# Patient Record
Sex: Female | Born: 1965 | Hispanic: No | Marital: Single
Health system: Southern US, Community
[De-identification: ages and names within clinical notes are randomized; demographics above are authoritative.]

---

## 2001-04-04 ENCOUNTER — Other Ambulatory Visit: Admission: RE | Admit: 2001-04-04 | Discharge: 2001-04-04 | Payer: Self-pay | Admitting: Internal Medicine

## 2003-05-23 ENCOUNTER — Other Ambulatory Visit: Admission: RE | Admit: 2003-05-23 | Discharge: 2003-05-23 | Payer: Self-pay | Admitting: Family Medicine

## 2003-05-24 ENCOUNTER — Other Ambulatory Visit: Admission: RE | Admit: 2003-05-24 | Discharge: 2003-05-24 | Payer: Self-pay | Admitting: Family Medicine

## 2004-05-26 ENCOUNTER — Ambulatory Visit: Payer: Self-pay | Admitting: Family Medicine

## 2004-05-26 ENCOUNTER — Other Ambulatory Visit: Admission: RE | Admit: 2004-05-26 | Discharge: 2004-05-26 | Payer: Self-pay | Admitting: Family Medicine

## 2004-05-27 ENCOUNTER — Encounter: Admission: RE | Admit: 2004-05-27 | Discharge: 2004-05-27 | Payer: Self-pay | Admitting: Family Medicine

## 2004-06-13 ENCOUNTER — Encounter: Admission: RE | Admit: 2004-06-13 | Discharge: 2004-06-13 | Payer: Self-pay | Admitting: Family Medicine

## 2005-01-14 ENCOUNTER — Encounter: Admission: RE | Admit: 2005-01-14 | Discharge: 2005-01-14 | Payer: Self-pay | Admitting: Family Medicine

## 2005-09-08 ENCOUNTER — Other Ambulatory Visit: Admission: RE | Admit: 2005-09-08 | Discharge: 2005-09-08 | Payer: Self-pay | Admitting: Family Medicine

## 2008-03-22 ENCOUNTER — Other Ambulatory Visit: Admission: RE | Admit: 2008-03-22 | Discharge: 2008-03-22 | Payer: Self-pay | Admitting: Family Medicine

## 2008-07-23 ENCOUNTER — Encounter: Admission: RE | Admit: 2008-07-23 | Discharge: 2008-07-23 | Payer: Self-pay | Admitting: Family Medicine

## 2010-02-23 ENCOUNTER — Encounter: Payer: Self-pay | Admitting: Family Medicine

## 2012-02-11 ENCOUNTER — Other Ambulatory Visit: Payer: Self-pay | Admitting: Family Medicine

## 2012-02-11 DIAGNOSIS — N63 Unspecified lump in unspecified breast: Secondary | ICD-10-CM

## 2012-02-19 ENCOUNTER — Ambulatory Visit
Admission: RE | Admit: 2012-02-19 | Discharge: 2012-02-19 | Disposition: A | Discharge status: Home / Self Care | Payer: BC Managed Care – PPO | Source: Ambulatory Visit | Attending: Family Medicine | Admitting: Family Medicine

## 2012-02-19 DIAGNOSIS — N63 Unspecified lump in unspecified breast: Secondary | ICD-10-CM

## 2013-10-05 ENCOUNTER — Other Ambulatory Visit: Payer: Self-pay | Admitting: Family Medicine

## 2013-10-05 DIAGNOSIS — N6002 Solitary cyst of left breast: Secondary | ICD-10-CM

## 2013-10-13 ENCOUNTER — Ambulatory Visit
Admission: RE | Admit: 2013-10-13 | Discharge: 2013-10-13 | Disposition: A | Discharge status: Home / Self Care | Payer: BC Managed Care – PPO | Source: Ambulatory Visit | Attending: Family Medicine | Admitting: Family Medicine

## 2013-10-13 ENCOUNTER — Encounter (INDEPENDENT_AMBULATORY_CARE_PROVIDER_SITE_OTHER): Payer: Self-pay

## 2013-10-13 DIAGNOSIS — N6002 Solitary cyst of left breast: Secondary | ICD-10-CM

## 2014-11-16 ENCOUNTER — Other Ambulatory Visit: Payer: Self-pay

## 2014-11-16 DIAGNOSIS — Z1231 Encounter for screening mammogram for malignant neoplasm of breast: Secondary | ICD-10-CM

## 2014-12-14 ENCOUNTER — Ambulatory Visit
Admission: RE | Admit: 2014-12-14 | Discharge: 2014-12-14 | Disposition: A | Discharge status: Home / Self Care | Payer: BC Managed Care – PPO | Source: Ambulatory Visit

## 2014-12-14 DIAGNOSIS — Z1231 Encounter for screening mammogram for malignant neoplasm of breast: Secondary | ICD-10-CM

## 2016-06-01 ENCOUNTER — Other Ambulatory Visit: Payer: Self-pay | Admitting: Family Medicine

## 2016-06-01 DIAGNOSIS — Z1231 Encounter for screening mammogram for malignant neoplasm of breast: Secondary | ICD-10-CM

## 2016-07-23 ENCOUNTER — Ambulatory Visit
Admission: RE | Admit: 2016-07-23 | Discharge: 2016-07-23 | Disposition: A | Discharge status: Home / Self Care | Payer: BC Managed Care – PPO | Source: Ambulatory Visit | Attending: Family Medicine | Admitting: Family Medicine

## 2016-07-23 DIAGNOSIS — Z1231 Encounter for screening mammogram for malignant neoplasm of breast: Secondary | ICD-10-CM

## 2017-03-15 ENCOUNTER — Encounter: Payer: Self-pay | Admitting: Physical Therapy

## 2017-03-15 ENCOUNTER — Other Ambulatory Visit: Payer: Self-pay

## 2017-03-15 ENCOUNTER — Ambulatory Visit: Payer: BC Managed Care – PPO | Attending: Family Medicine | Admitting: Physical Therapy

## 2017-03-15 ENCOUNTER — Ambulatory Visit: Payer: BC Managed Care – PPO | Admitting: Physical Therapy

## 2017-03-15 DIAGNOSIS — R293 Abnormal posture: Secondary | ICD-10-CM | POA: Insufficient documentation

## 2017-03-15 DIAGNOSIS — M25511 Pain in right shoulder: Secondary | ICD-10-CM | POA: Insufficient documentation

## 2017-03-15 DIAGNOSIS — M545 Low back pain, unspecified: Secondary | ICD-10-CM

## 2017-03-15 DIAGNOSIS — M25611 Stiffness of right shoulder, not elsewhere classified: Secondary | ICD-10-CM | POA: Diagnosis present

## 2017-03-15 NOTE — Therapy (Signed)
[facility] [place] [address] [place], [place], [address] Phone: [phone]   Fax:  [phone]  Physical Therapy Evaluation  Patient Details  Name: Summer Owens MRN: [id number] Date of Birth: December 05, 1965 Referring Provider: Silvestre Moment   Encounter Date: 03/15/2017  PT End of Session - 03/15/17 0949    Visit Number  1    Date for PT Re-Evaluation  05/13/17    PT Start Time  0858    PT Stop Time  0958    PT Time Calculation (min)  60 min    Activity Tolerance  Patient tolerated treatment well    Behavior During Therapy  [facility] for tasks assessed/performed       History reviewed. No pertinent past medical history.  History reviewed. No pertinent surgical history.  There were no vitals filed for this visit.   Subjective Assessment - 03/15/17 0901    Subjective  Patient reports that she feels like the injury happened about 9 months ago when she was doing a crunch and had a 10# weight in her hands.   She reports that she felt right rib pain at that time.  She reports that over time the pain is now in the right shoulder, she reports that she feel that the right rib has an "indentation".  She reports that she has some mild scoliosis and has right hip pain.      Patient Stated Goals  have less pain    Currently in Pain?  Yes    Pain Score  1     Pain Location  Shoulder and right hip    Pain Orientation  Right    Pain Descriptors / Indicators  Tightness    Pain Type  Acute pain    Pain Onset  More than a month ago    Pain Frequency  Constant    Aggravating Factors   for the right shoulder reaching out and back up and back will increase the pain to 4/10, the right hip pain is worse with trying to jog pain a 6/10, standing on a hard surface and looking down.  Driving witout using the cruise control will cause the right hip pain    Pain Relieving Factors  childs pose helps the hip, chanign posistions helps some    Effect of  Pain on Daily Activities  just feels tight and has some pain         [facility] PT Assessment - 03/15/17 0001      Assessment   Medical Diagnosis  right shoulder and hip pain    Referring Provider  Silvestre Moment    Onset Date/Surgical Date  02/12/17    Hand Dominance  Right    Prior Therapy  no      Precautions   Precautions  None      Balance Screen   Has the patient fallen in the past 6 months  No    Has the patient had a decrease in activity level because of a fear of falling?   No    Is the patient reluctant to leave their home because of a fear of falling?   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  Leisure  Dancing 1-2x/week, some free weights at home, pushups and sit ups      Posture/Postural Control   Posture Comments  she has pretty good sitting posture, but reports at work she is ofter leaning and looking to one side that may contribute to her pain      ROM / Strength   AROM / PROM / Strength  AROM;Strength      AROM   Overall AROM Comments  Hip ROM was WFL's some tightness and pain with piriformis stretch    AROM Assessment Site  Shoulder;Hip    Right/Left Shoulder  Right    Right Shoulder Flexion  180 Degrees    Right Shoulder ABduction  160 Degrees    Right Shoulder Internal Rotation  40 Degrees c/o tightness and some pain    Right Shoulder External Rotation  90 Degrees    Right/Left Hip  Right      Strength   Overall Strength Comments  right hip 4+/5, right shoulder 4+/5 except for ER 3+/5 with mild pain      Flexibility   Soft Tissue Assessment /Muscle Length  yes    ITB  tight    Piriformis  tight with some of her pain      Palpation   Palpation comment  she has a knot in the right upper trap and the supraspinatus area, she has tightness in the  cervical parapsinals, she has a lot of spasms in the rhomboid area with some scoliosis palpable.  She had decreased segmental mobility of the thoracic spine and the ribs.      Special Tests   Other special tests  negative empty can, + impingement right shoulder             Objective measurements completed on examination: See above findings.      [facility] Adult PT Treatment/Exercise - 03/15/17 0001      Modalities   Modalities  Electrical Stimulation;Moist Heat      Moist Heat Therapy   Number Minutes Moist Heat  15 Minutes    Moist Heat Location  Cervical;Lumbar Spine      Electrical Stimulation   Electrical Stimulation Location  right upper trap, right SI    Electrical Stimulation Action  Premod    Electrical Stimulation Parameters  supine    Electrical Stimulation Goals  Pain             PT Education - 03/15/17 0948    Education provided  Yes    Education Details  thoracic mobility, scapular stability, cervical stretches, piriformis stretches, information about TENS       PT Short Term Goals - 03/15/17 1009      PT SHORT TERM GOAL #1   Title  independent with initial HEP    Time  2    Period  Weeks    Status  New        PT Long Term Goals - 03/15/17 1010      PT LONG TERM GOAL #1   Title  report pain decreased 50%    Time  8    Period  Weeks    Status  New      PT LONG TERM GOAL #2   Title  increase right shoulder IR to 70 degrees    Time  8    Period  Weeks    Status  New             Plan - 03/15/17 1308  Clinical Impression Statement  Patient reports a hx of scoliosis, she has right shoulder and right low back pain she reports that she feels this started about 9 months ago while doing a sit up with a 10# weight in her hands overhead.  She also reports that she feels her ribs on the right side "sink in".  She ahs significant knot in the right upper trap, decreased rib and thoracic mobility.  Tightness in teh right piriformis mms     Clinical Presentation  Stable    Clinical Decision Making  Low    Rehab Potential  Good    PT Frequency  1x / week    PT Duration  2 weeks    PT Treatment/Interventions  ADLs/Self Care Home Management;Cryotherapy;Electrical Stimulation;Moist Heat;Neuromuscular re-education;Therapeutic exercise;Therapeutic activities;Patient/family education;Manual techniques;Dry needling    PT Next Visit Plan  Patient has a hgih deductible, may not return, gave a lot of information and exercises for her to do on her own    Consulted and Agree with Plan of Care  Patient       Patient will benefit from skilled therapeutic intervention in order to improve the following deficits and impairments:  Decreased strength, Postural dysfunction, Improper body mechanics, Impaired flexibility, Pain, Increased muscle spasms, Decreased range of motion  Visit Diagnosis: Acute pain of right shoulder - Plan: PT plan of care cert/re-cert  Stiffness of right shoulder, not elsewhere classified - Plan: PT plan of care cert/re-cert  Acute right-sided low back pain without sciatica - Plan: PT plan of care cert/re-cert  Abnormal posture - Plan: PT plan of care cert/re-cert     Problem List There are no active problems to display for this patient.   Jearld LeschALBRIGHT,Matika Bartell W., PT 03/15/2017, 10:13 AM  [facility] [place] [address] [place], [place], [address] Phone: [phone]   Fax:  [phone]  Name: Summer Owens MRN: [id number] Date of Birth: 07/03/1965

## 2017-06-29 ENCOUNTER — Other Ambulatory Visit: Payer: Self-pay | Admitting: Family Medicine

## 2017-06-29 DIAGNOSIS — Z1231 Encounter for screening mammogram for malignant neoplasm of breast: Secondary | ICD-10-CM

## 2017-08-17 ENCOUNTER — Ambulatory Visit
Admission: RE | Admit: 2017-08-17 | Discharge: 2017-08-17 | Disposition: A | Discharge status: Home / Self Care | Payer: BC Managed Care – PPO | Source: Ambulatory Visit | Attending: Family Medicine | Admitting: Family Medicine

## 2017-08-17 DIAGNOSIS — Z1231 Encounter for screening mammogram for malignant neoplasm of breast: Secondary | ICD-10-CM

## 2019-05-29 ENCOUNTER — Other Ambulatory Visit: Payer: Self-pay | Admitting: Family Medicine

## 2019-05-29 DIAGNOSIS — Z1231 Encounter for screening mammogram for malignant neoplasm of breast: Secondary | ICD-10-CM

## 2019-06-29 ENCOUNTER — Other Ambulatory Visit: Payer: Self-pay

## 2019-06-29 ENCOUNTER — Ambulatory Visit
Admission: RE | Admit: 2019-06-29 | Discharge: 2019-06-29 | Disposition: A | Discharge status: Home / Self Care | Payer: BC Managed Care – PPO | Source: Ambulatory Visit | Attending: Family Medicine | Admitting: Family Medicine

## 2019-06-29 DIAGNOSIS — Z1231 Encounter for screening mammogram for malignant neoplasm of breast: Secondary | ICD-10-CM

## 2020-09-19 ENCOUNTER — Other Ambulatory Visit: Payer: Self-pay | Admitting: Family Medicine

## 2020-09-19 ENCOUNTER — Other Ambulatory Visit: Payer: Self-pay | Admitting: Diagnostic Radiology

## 2020-09-19 DIAGNOSIS — Z1231 Encounter for screening mammogram for malignant neoplasm of breast: Secondary | ICD-10-CM

## 2020-10-15 ENCOUNTER — Ambulatory Visit
Admission: RE | Admit: 2020-10-15 | Discharge: 2020-10-15 | Disposition: A | Discharge status: Home / Self Care | Payer: BC Managed Care – PPO | Source: Ambulatory Visit | Attending: Family Medicine | Admitting: Family Medicine

## 2020-10-15 ENCOUNTER — Other Ambulatory Visit: Payer: Self-pay

## 2020-10-15 DIAGNOSIS — Z1231 Encounter for screening mammogram for malignant neoplasm of breast: Secondary | ICD-10-CM

## 2022-03-06 ENCOUNTER — Other Ambulatory Visit: Payer: Self-pay | Admitting: Family Medicine

## 2022-03-06 DIAGNOSIS — Z1231 Encounter for screening mammogram for malignant neoplasm of breast: Secondary | ICD-10-CM

## 2022-03-10 ENCOUNTER — Ambulatory Visit
Admission: RE | Admit: 2022-03-10 | Discharge: 2022-03-10 | Disposition: A | Discharge status: Home / Self Care | Payer: BC Managed Care – PPO | Source: Ambulatory Visit | Attending: Family Medicine | Admitting: Family Medicine

## 2022-03-10 DIAGNOSIS — Z1231 Encounter for screening mammogram for malignant neoplasm of breast: Secondary | ICD-10-CM

## 2022-03-13 ENCOUNTER — Other Ambulatory Visit: Payer: Self-pay | Admitting: Family Medicine

## 2022-03-13 DIAGNOSIS — R928 Other abnormal and inconclusive findings on diagnostic imaging of breast: Secondary | ICD-10-CM

## 2022-03-30 ENCOUNTER — Ambulatory Visit
Admission: RE | Admit: 2022-03-30 | Discharge: 2022-03-30 | Disposition: A | Discharge status: Home / Self Care | Payer: BC Managed Care – PPO | Source: Ambulatory Visit | Attending: Family Medicine | Admitting: Family Medicine

## 2022-03-30 DIAGNOSIS — R928 Other abnormal and inconclusive findings on diagnostic imaging of breast: Secondary | ICD-10-CM

## 2022-12-25 IMAGING — MG MM DIGITAL SCREENING BILAT W/ TOMO AND CAD
8 series · 9 of 24 positions shown · non-contrast
Comparison: Previous exam(s).

CLINICAL DATA: Screening.

EXAM:
DIGITAL SCREENING BILATERAL MAMMOGRAM WITH TOMOSYNTHESIS AND CAD
TECHNIQUE: Bilateral screening digital craniocaudal and mediolateral oblique
mammograms were obtained. Bilateral screening digital breast
tomosynthesis was performed. The images were evaluated with
computer-aided detection.

[L MLO synth-2D]
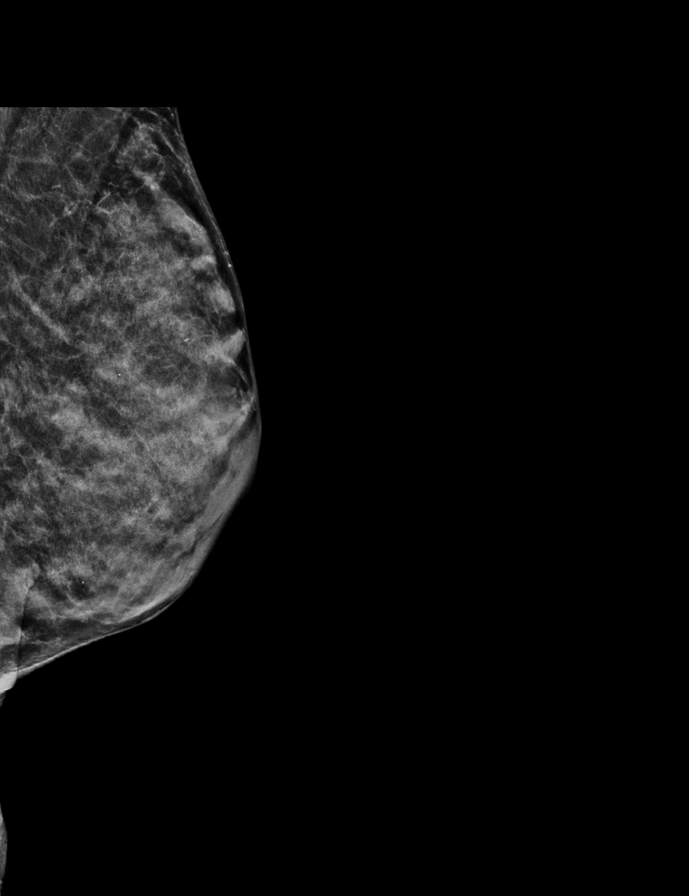

[R MLO synth-2D]
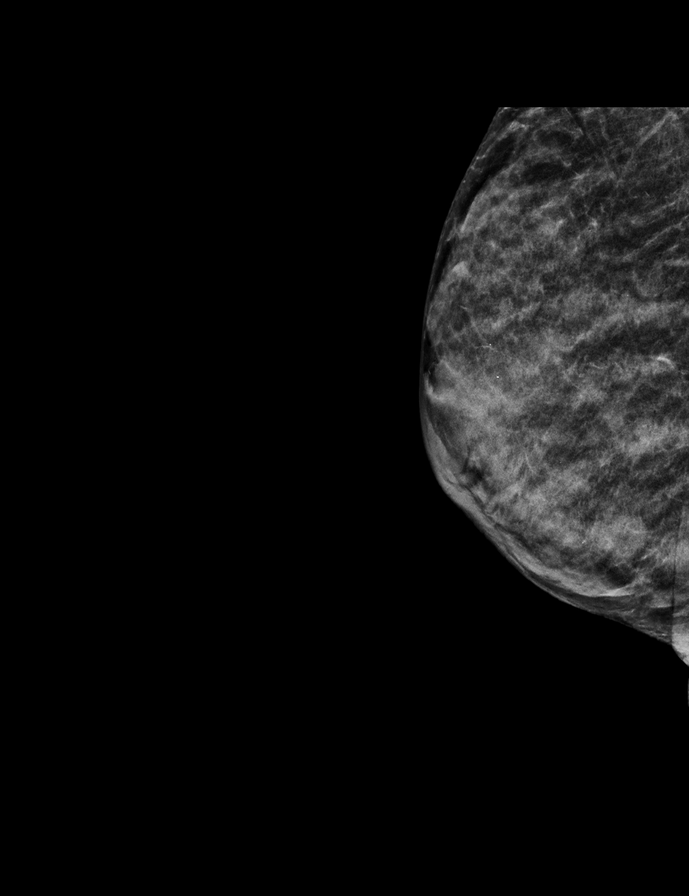

[R CC synth-2D]
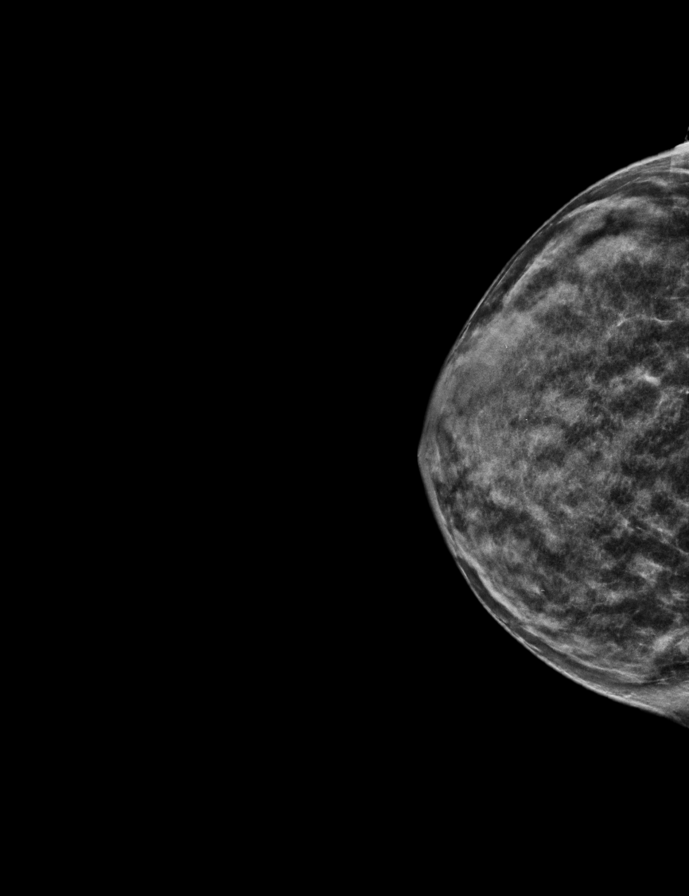

[L CC synth-2D]
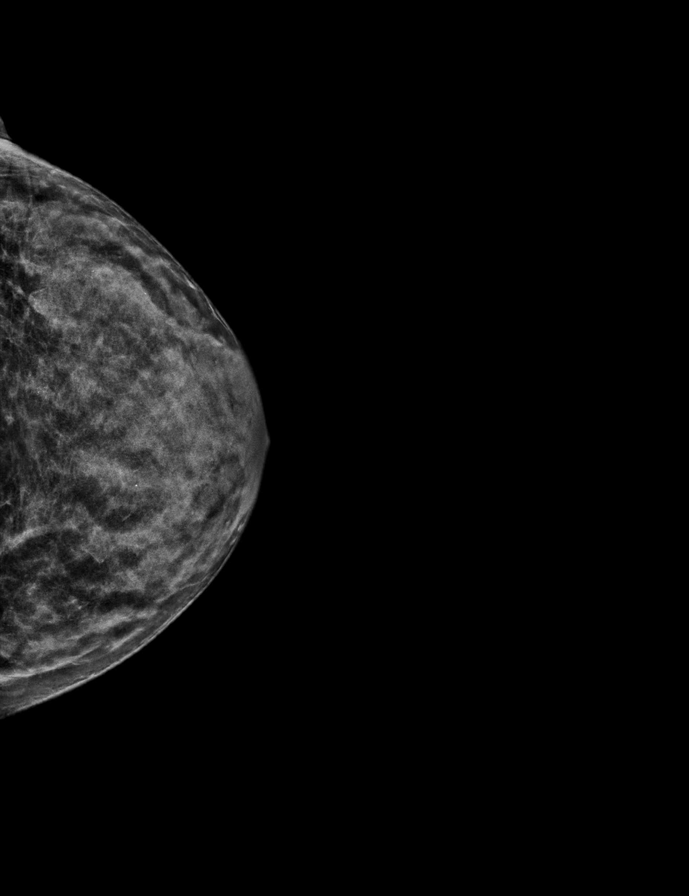

[L MLO tomo · 2 of 45 frames shown]
[frame 15/45]
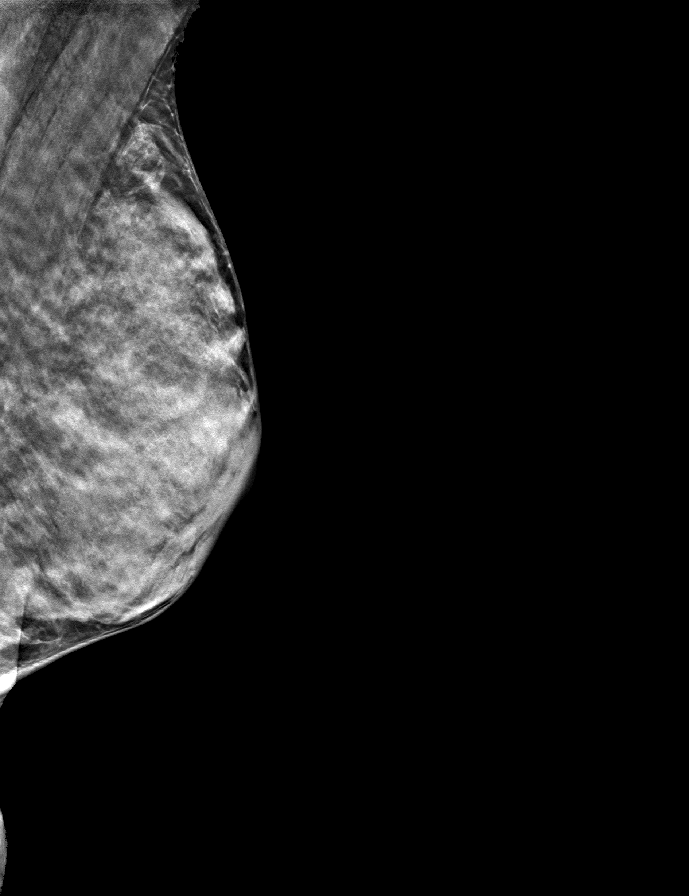
[frame 23/45]
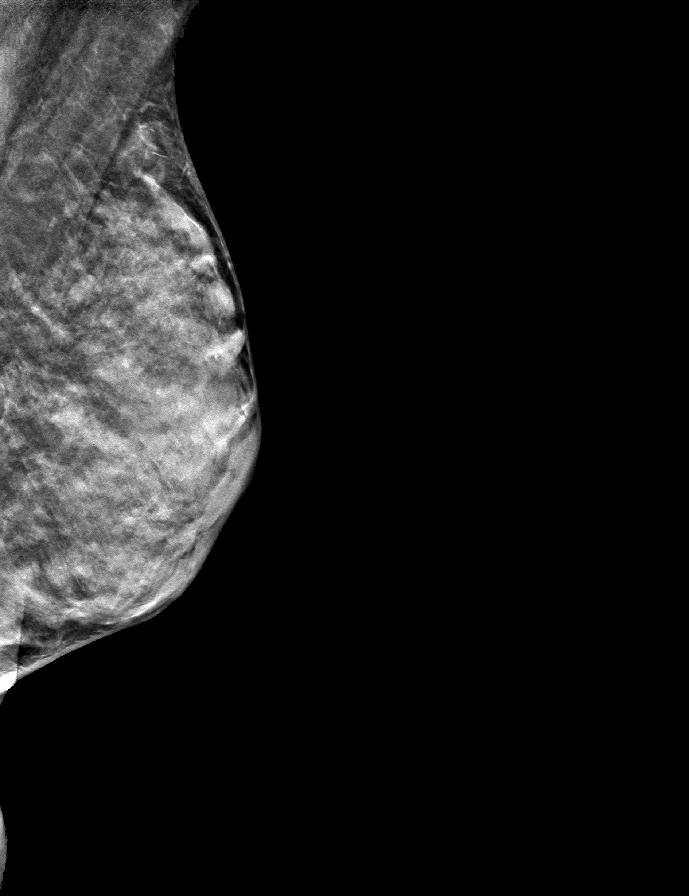

[L CC tomo · tomo slice 25/48.0]
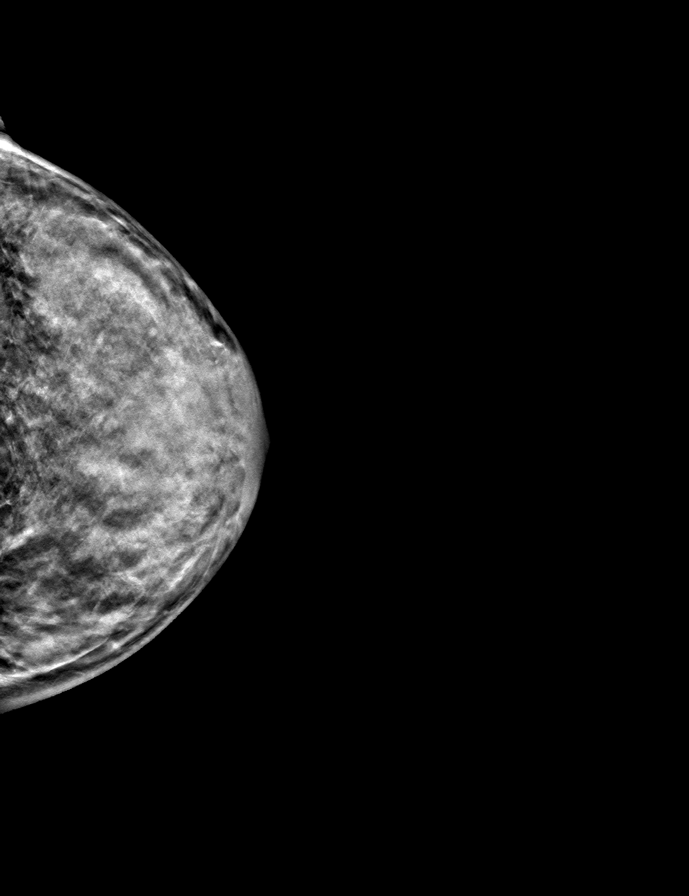

[R MLO tomo · tomo slice 23/46.0]
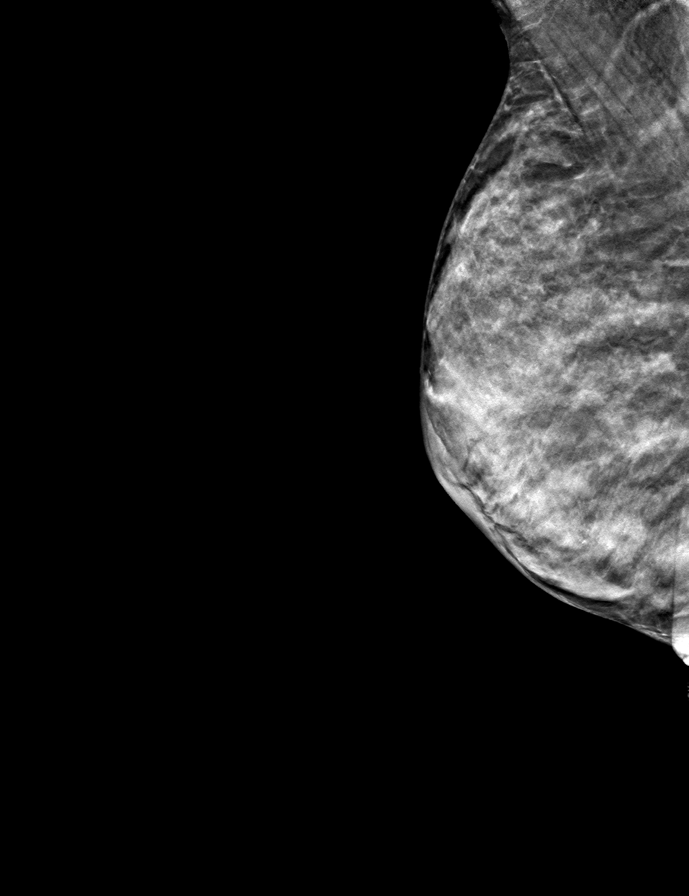

[R CC tomo · tomo slice 23/46.0]
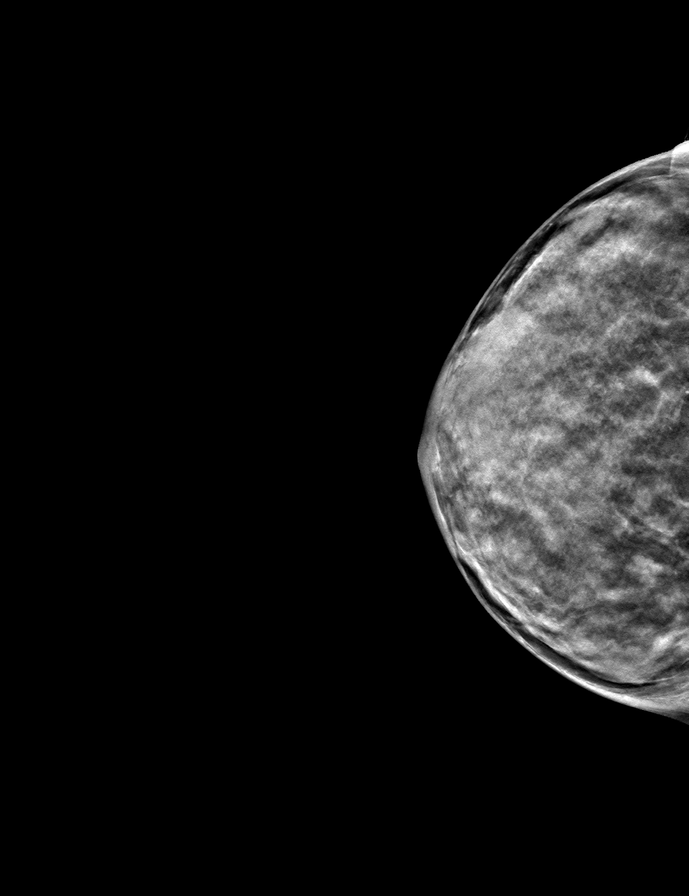

[9 of 24 positions shown; findings below may reference images not displayed]

ACR Breast Density Category d: The breast tissue is extremely dense,
which lowers the sensitivity of mammography
FINDINGS: There are no findings suspicious for malignancy.
IMPRESSION: No mammographic evidence of malignancy. A result letter of this
screening mammogram will be mailed directly to the patient.

RECOMMENDATION:
Screening mammogram in one year. (Code:[id number])

BI-RADS CATEGORY  1: Negative.

## 2023-08-26 ENCOUNTER — Other Ambulatory Visit: Payer: Self-pay | Admitting: Family Medicine

## 2023-08-26 DIAGNOSIS — Z1231 Encounter for screening mammogram for malignant neoplasm of breast: Secondary | ICD-10-CM

## 2023-09-07 ENCOUNTER — Ambulatory Visit
Admission: RE | Admit: 2023-09-07 | Discharge: 2023-09-07 | Disposition: A | Discharge status: Home / Self Care | Payer: Self-pay | Source: Ambulatory Visit | Attending: Family Medicine | Admitting: Family Medicine

## 2023-09-07 DIAGNOSIS — Z1231 Encounter for screening mammogram for malignant neoplasm of breast: Secondary | ICD-10-CM
